# Patient Record
Sex: Male | Born: 1990 | Race: Black or African American | Hispanic: No | Marital: Married | Smoking: Current some day smoker
Health system: Southern US, Community
[De-identification: ages and names within clinical notes are randomized; demographics above are authoritative.]

## PROBLEM LIST (undated history)

## (undated) ENCOUNTER — Emergency Department (HOSPITAL_COMMUNITY): Admission: EM | Payer: Self-pay

## (undated) HISTORY — PX: APPENDECTOMY: SHX54

---
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
## 2010-10-31 NOTE — Discharge Summary (Signed)
   NAMECABELL, LAZENBY                        ACCOUNT NO.:  [id number]   MEDICAL RECORD NO.:  [id number]                   PATIENT TYPE:  INP   LOCATION:  A315                                 FACILITY:  [facility]   PHYSICIAN:  Dirk Dress. Katrinka Blazing, M.D.                DATE OF BIRTH:  03/10/1991   DATE OF ADMISSION:  09/03/2002  DATE OF DISCHARGE:  09/06/2002                                 DISCHARGE SUMMARY   DISCHARGE DIAGNOSIS:  Acute appendicitis.   SPECIAL PROCEDURE:  Laparoscopic appendectomy.   DISPOSITION:  The patient is discharged home in stable and satisfactory  condition.   DISCHARGE MEDICATIONS:  1. Tylenol with codeine 1 every four hours as needed for pain.  2. Keflex 500 mg four times a day for five days.   SUMMARY:  An 20 year old male child with a 36-hour history of abdominal pain  without nausea or vomiting.  The pain progressed throughout the day.  The  pain started initially in his upper abdomen and the right rib cage but later  it localized to the  right lower quadrant.  The patient was evaluated and  was noted to be afebrile though he had a white count of 15,000.  CT scan  showed an enlarged, inflamed appendix with an appendicolith and  periappendiceal inflammation.   PAST MEDICAL HISTORY:  Unremarkable.   PHYSICAL EXAMINATION:  ABDOMEN:  There is tenderness more prominent in the  lower abdomen.  He also had some suprapubic tenderness.   DISPOSITION:  After admission the patient was taken to the operating room  and explored.  He was found to have an acute appendicitis which was removed  laparoscopically without incident.  He had an uneventful postoperative  course.  He remained afebrile.  White count remained normal.  His diet was  advanced without difficulty and he was discharged home on March 24 in  satisfactory condition.      Dirk Dress. Katrinka Blazing, M.D.                      Dirk Dress. Katrinka Blazing, M.D.    LCS/MEDQ  D:  09/23/2002  T:  09/23/2002  Job:  [id number]

## 2010-10-31 NOTE — H&P (Signed)
Joseph Fields, Joseph Fields                        ACCOUNT NO.:  [id number]   MEDICAL RECORD NO.:  [id number]                   PATIENT TYPE:  INP   LOCATION:  A315                                 FACILITY:  [facility]   PHYSICIAN:  Dirk Dress. Katrinka Blazing, M.D.                DATE OF BIRTH:  1990/08/10   DATE OF ADMISSION:  09/03/2002  DATE OF DISCHARGE:                                HISTORY & PHYSICAL   HISTORY OF PRESENT ILLNESS:  [age]-year-old male child with a 36-hour  history of abdominal pain without nausea or vomiting.  The patient had onset  of pain about midday yesterday.  The pain progressed throughout the day.  He  initially complained of rib cage pain but later complained of pain in his  lower abdomen.  He had no nausea, vomiting and no diarrhea.  He was  evaluated in the emergency room and was noted to be afebrile, but he had a  white count of 15,900.  CT scan showed an enlarged, inflamed appendix with  an appendicolith and periappendiceal inflammation.   PAST HISTORY:  He has no major illness.  He does have ADHD and is on  maintenance Ritalin; mother does not know the dose.  He has not had prior hospitalizations.   FAMILY HISTORY:  There is a family history of sickle cell disease,  hypertension, diabetes mellitus and asthma.   IMMUNIZATIONS:  All of his immunizations are up to date.   PHYSICAL EXAMINATION:  GENERAL:  He is a pleasant, calm child in no acute  distress.  VITAL SIGNS:  Blood pressure 114/56, pulse 78, respirations 20, temperature  97.9.  HEENT:  No abnormality.  Teeth are in fair repair.  Oropharynx is  unremarkable.  NECK:  Neck supple.  No JVD, adenopathy, thyromegaly.  CHEST:  Chest clear to auscultation.  No rubs, rales, rhonchi or wheezes.  HEART:  Regular rate and rhythm without murmur, gallop or rub.  There is no  tachycardia.  There is a very soft systolic murmur.  ABDOMEN:  Mildly distended.  Active bowel sounds.  Diffuse tenderness, more  prominent  tenderness in lower abdomen, difficult to determine whether or not  it is more prominent on the left or the right.  He does have significant  suprapubic tenderness.  He has guarding.  There are no masses.  EXTREMITIES:  He has normal soft tissue and joint exam.  He has full pulses,  no cyanosis or clubbing.  NEUROLOGIC:  No motor, sensory or cerebellar deficit.   IMPRESSION:  Acute appendicitis.   PLAN:  The patient will be admitted, treated with IV Ancef and given IV  analgesics.  He will receive antipyretics if he should develop an elevated  temperature.  His appendectomy is scheduled for the early morning.  Mother  has been counseled for the procedure.  Dirk Dress. Katrinka Blazing, M.D.     LCS/MEDQ  D:  09/04/2002  T:  09/04/2002  Job:  [id number]

## 2010-10-31 NOTE — Op Note (Signed)
   Joseph Fields, Joseph Fields                        ACCOUNT NO.:  [id number]   MEDICAL RECORD NO.:  [id number]                   PATIENT TYPE:  INP   LOCATION:  A315                                 FACILITY:  [facility]   PHYSICIAN:  Dirk Dress. Katrinka Blazing, M.D.                DATE OF BIRTH:  1990/09/06   DATE OF PROCEDURE:  09/04/2002  DATE OF DISCHARGE:                                 OPERATIVE REPORT   PREOPERATIVE DIAGNOSES:  Acute appendicitis.   POSTOPERATIVE DIAGNOSES:  Acute appendicitis.   PROCEDURE:  Laparoscopic appendectomy.   SURGEON:  Elpidio Anis, M.D.   DESCRIPTION OF PROCEDURE:  Under general anesthesia, the patient's abdomen  was prepped and draped in a sterile field. A supraumbilical incision was  made. A Veress needle was inserted. The abdomen was insufflated with 2  liters of CO2. Using a Visiport guide, a 10 mm port was placed uneventfully.  A laparoscope was placed. The inflamed appendix was identified. Under  videoscopic guidance, a suprapubic 5 mm port and a left lower quadrant 12 mm  port were placed under videoscopic guidance. The appendix was grasped with a  grasper. The mesoappendix was dissected, clips with multiple clips and  divided down through the base of the appendix. The base of the appendix was  transected using an EndoGIA stapler with 2.5 mm staples. The staple line was  inspected. The appendix was retrieved. Copious irrigation was carried out.  There was no bleeding from the mesoappendix or the stump. There was a small  amount of fluid in the pelvis which was suctioned and irrigated. The patient  tolerated the procedure well. CO2 was allowed to escape from the abdomen and  the ports were removed. The incisions were closed using #0 Dexon on the  fascia of the two larger incisions and staples on the skin. Dressings were  placed. The patient was awakened from anesthesia uneventfully, transferred  to a bed and taken to the post anesthesia care unit.                                        Dirk Dress. Katrinka Blazing, M.D.    LCS/MEDQ  D:  09/04/2002  T:  09/04/2002  Job:  [id number]

## 2013-12-08 ENCOUNTER — Encounter (HOSPITAL_COMMUNITY): Payer: Self-pay | Admitting: Emergency Medicine

## 2013-12-08 ENCOUNTER — Emergency Department (HOSPITAL_COMMUNITY)
Admission: EM | Admit: 2013-12-08 | Discharge: 2013-12-08 | Disposition: A | Discharge status: Home / Self Care | Payer: Self-pay | Attending: Emergency Medicine | Admitting: Emergency Medicine

## 2013-12-08 DIAGNOSIS — Z792 Long term (current) use of antibiotics: Secondary | ICD-10-CM | POA: Insufficient documentation

## 2013-12-08 DIAGNOSIS — K029 Dental caries, unspecified: Secondary | ICD-10-CM | POA: Insufficient documentation

## 2013-12-08 DIAGNOSIS — Z87828 Personal history of other (healed) physical injury and trauma: Secondary | ICD-10-CM | POA: Insufficient documentation

## 2013-12-08 DIAGNOSIS — K047 Periapical abscess without sinus: Secondary | ICD-10-CM | POA: Insufficient documentation

## 2013-12-08 DIAGNOSIS — F17200 Nicotine dependence, unspecified, uncomplicated: Secondary | ICD-10-CM | POA: Insufficient documentation

## 2013-12-08 MED ORDER — HYDROCODONE-ACETAMINOPHEN 5-325 MG PO TABS
1.0000 | ORAL_TABLET | Freq: Once | ORAL | Status: AC
  Administered 2013-12-08: 1 via ORAL
  Filled 2013-12-08: qty 1

## 2013-12-08 MED ORDER — IBUPROFEN 600 MG PO TABS: 600.0000 mg | ORAL_TABLET | Freq: Four times a day (QID) | ORAL | Status: DC | PRN

## 2013-12-08 MED ORDER — AMOXICILLIN 500 MG PO CAPS: 500.0000 mg | ORAL_CAPSULE | Freq: Three times a day (TID) | ORAL | Status: DC

## 2013-12-08 MED ORDER — AMOXICILLIN 250 MG PO CAPS
500.0000 mg | ORAL_CAPSULE | Freq: Once | ORAL | Status: AC
  Administered 2013-12-08: 500 mg via ORAL
  Filled 2013-12-08: qty 2

## 2013-12-08 MED ORDER — HYDROMORPHONE HCL PF 1 MG/ML IJ SOLN: 1.0000 mg | Freq: Once | INTRAMUSCULAR | Status: DC

## 2013-12-08 MED ORDER — HYDROCODONE-ACETAMINOPHEN 5-325 MG PO TABS: 1.0000 | ORAL_TABLET | ORAL | Status: DC | PRN

## 2013-12-08 NOTE — Discharge Instructions (Signed)
Abscessed Tooth °An abscessed tooth is an infection around your tooth. It may be caused by holes or damage to the tooth (cavity) or a dental disease. An abscessed tooth causes mild to very bad pain in and around the tooth. See your dentist right away if you have tooth or gum pain. °HOME CARE °· Take your medicine as told. Finish it even if you start to feel better. °· Do not drive after taking pain medicine. °· Rinse your mouth (gargle) often with salt water (¼ teaspoon salt in 8 ounces of warm water). °· Do not apply heat to the outside of your face. °GET HELP RIGHT AWAY IF:  °· You have a temperature by mouth above 102° F (38.9° C), not controlled by medicine. °· You have chills and a very bad headache. °· You have problems breathing or swallowing. °· Your mouth will not open. °· You develop puffiness (swelling) on the neck or around the eye. °· Your pain is not helped by medicine. °· Your pain is getting worse instead of better. °MAKE SURE YOU:  °· Understand these instructions. °· Will watch your condition. °· Will get help right away if you are not doing well or get worse. °Document Released: 11/18/2007 Document Revised: 08/24/2011 Document Reviewed: 09/09/2010 °[employer]® Patient Information ©2015 [employer]. This information is not intended to replace advice given to you by your health care provider. Make sure you discuss any questions you have with your health care provider. ° ° ° °Complete your entire course of antibiotics as prescribed.  You  may use the hydrocodone for pain relief but do not drive within 4 hours of taking as this will make you drowsy.  Avoid applying heat or ice to this abscess area which can worsen your symptoms.  You may use warm salt water swish and spit treatment or half peroxide and water swish and spit after meals to keep this area clean as discussed.  Call the dentist listed above for further management of your symptoms. ° ° °

## 2013-12-08 NOTE — ED Notes (Signed)
Reviewed local dental resources w/patient.

## 2013-12-08 NOTE — ED Provider Notes (Signed)
CSN: [id number]     Arrival date & time 12/08/13  1245 History   First MD Initiated Contact with Patient 12/08/13 1328     Chief Complaint  Patient presents with  . Dental Pain     (Consider location/radiation/quality/duration/timing/severity/associated sxs/prior Treatment) HPI Comments: Joseph Fields is a 23 y.o. Male presenting with 2 week history of dental pain and now has developed gingival swelling over the past 2 days.   The patient has a history of injury and/or decay in the tooth involved which has recently started to cause increased  pain.  There has been no fevers, chills, nausea or vomiting, also no complaint of difficulty swallowing, although chewing makes pain worse.  The patient has tried no medicines without relief of symptoms.         The history is provided by the patient.    History reviewed. No pertinent past medical history. Past Surgical History  Procedure Laterality Date  . Appendectomy     History reviewed. No pertinent family history. History  Substance Use Topics  . Smoking status: Current Some Day Smoker  . Smokeless tobacco: Not on file  . Alcohol Use: Yes    Review of Systems  Constitutional: Negative for fever.  HENT: Positive for dental problem. Negative for facial swelling and sore throat.   Respiratory: Negative for shortness of breath.   Musculoskeletal: Negative for neck pain and neck stiffness.      Allergies  Review of patient's allergies indicates no known allergies.  Home Medications   Prior to Admission medications   Medication Sig Start Date End Date Taking? Authorizing Provider  amoxicillin (AMOXIL) 500 MG capsule Take 1 capsule (500 mg total) by mouth 3 (three) times daily. 12/08/13   Burgess AmorJulie Idol, PA-C  HYDROcodone-acetaminophen (NORCO/VICODIN) 5-325 MG per tablet Take 1 tablet by mouth every 4 (four) hours as needed for severe pain. 12/08/13   Burgess AmorJulie Idol, PA-C  ibuprofen (ADVIL,MOTRIN) 600 MG tablet Take 1 tablet (600 mg  total) by mouth every 6 (six) hours as needed for mild pain. 12/08/13   Burgess AmorJulie Idol, PA-C   BP 116/65  Pulse 74  Temp(Src) 97.9 F (36.6 C) (Oral)  Resp 18  Ht 5\' 8"  (1.727 m)  Wt 146 lb (66.225 kg)  BMI 22.20 kg/m2  SpO2 98% Physical Exam  Constitutional: He is oriented to person, place, and time. He appears well-developed and well-nourished. No distress.  HENT:  Head: Normocephalic and atraumatic.  Right Ear: Tympanic membrane and external ear normal.  Left Ear: Tympanic membrane and external ear normal.  Mouth/Throat: Oropharynx is clear and moist and mucous membranes are normal. No oral lesions. No trismus in the jaw. Dental abscesses present.    Induration along left jaw line.  There is no facial erythema.  There is no fluctuant mass or evidence of a drainable abscess collection.  Deep cavities in his left lower molar teeth.  Generalized dental decay.  Eyes: Conjunctivae are normal.  Neck: Normal range of motion. Neck supple.  Cardiovascular: Normal rate and normal heart sounds.   Pulmonary/Chest: Effort normal.  Abdominal: He exhibits no distension.  Musculoskeletal: Normal range of motion.  Lymphadenopathy:    He has no cervical adenopathy.  Neurological: He is alert and oriented to person, place, and time.  Skin: Skin is warm and dry. No erythema.  Psychiatric: He has a normal mood and affect.    ED Course  Procedures (including critical care time) Labs Review Labs Reviewed - No data to display  Imaging Review No results found.   EKG Interpretation None      MDM   Final diagnoses:  Dental abscess    Amoxil, hydrocodone and ibuprofen.  Dental referrals given.  No evidence of drainable dental abscess.  There is no facial infection at this time, no trismus.    Burgess AmorJulie Idol, PA-C 12/08/13 1407

## 2013-12-08 NOTE — ED Provider Notes (Signed)
Medical screening examination/treatment/procedure(s) were performed by non-physician practitioner and as supervising physician I was immediately available for consultation/collaboration.   EKG Interpretation None        Joseph L Zammit, MD 12/08/13 1529 

## 2013-12-08 NOTE — ED Notes (Signed)
Dental pain lt mandibular molar. For 2 weeks

## 2013-12-08 NOTE — ED Notes (Signed)
Patient with no complaints at this time. Respirations even and unlabored. Skin warm/dry. Discharge instructions reviewed with patient at this time. Patient given opportunity to voice concerns/ask questions. Patient discharged at this time and left Emergency Department with steady gait.   

## 2014-02-27 ENCOUNTER — Emergency Department (INDEPENDENT_AMBULATORY_CARE_PROVIDER_SITE_OTHER)
Admission: EM | Admit: 2014-02-27 | Discharge: 2014-02-27 | Disposition: A | Discharge status: Home / Self Care | Payer: Self-pay | Source: Home / Self Care | Attending: Family Medicine | Admitting: Family Medicine

## 2014-02-27 ENCOUNTER — Encounter (HOSPITAL_COMMUNITY): Payer: Self-pay | Admitting: Emergency Medicine

## 2014-02-27 DIAGNOSIS — G8929 Other chronic pain: Secondary | ICD-10-CM

## 2014-02-27 DIAGNOSIS — K089 Disorder of teeth and supporting structures, unspecified: Secondary | ICD-10-CM

## 2014-02-27 MED ORDER — DICLOFENAC POTASSIUM 50 MG PO TABS: 50.0000 mg | ORAL_TABLET | Freq: Three times a day (TID) | ORAL | Status: DC

## 2014-02-27 MED ORDER — CLINDAMYCIN HCL 300 MG PO CAPS: 300.0000 mg | ORAL_CAPSULE | Freq: Three times a day (TID) | ORAL | Status: DC

## 2014-02-27 NOTE — ED Notes (Signed)
Left bottom tooth pain, onset 3 days ago.

## 2014-02-27 NOTE — ED Provider Notes (Signed)
CSN: [id number]     Arrival date & time 02/27/14  1038 History   First MD Initiated Contact with Patient 02/27/14 1104     Chief Complaint  Patient presents with  . Dental Pain   (Consider location/radiation/quality/duration/timing/severity/associated sxs/prior Treatment) Patient is a 23 y.o. male presenting with tooth pain. The history is provided by the patient.  Dental Pain Location:  Lower Lower teeth location:  17/LL 3rd molar, 18/LL 2nd molar, 19/LL 1st molar, 30/RL 1st molar, 31/RL 2nd molar and 32/RL 3rd molar Quality:  Throbbing Severity:  Moderate Onset quality:  Gradual Duration:  4 days Progression:  Waxing and waning Chronicity:  Recurrent Context: abscess, dental caries, dental fracture and poor dentition   Relieved by:  None tried Associated symptoms: facial pain and facial swelling   Associated symptoms: no fever   Risk factors: lack of dental care, periodontal disease and smoking     History reviewed. No pertinent past medical history. Past Surgical History  Procedure Laterality Date  . Appendectomy     No family history on file. History  Substance Use Topics  . Smoking status: Current Some Day Smoker  . Smokeless tobacco: Not on file  . Alcohol Use: Yes    Review of Systems  Constitutional: Negative.  Negative for fever.  HENT: Positive for dental problem and facial swelling.     Allergies  Review of patient's allergies indicates no known allergies.  Home Medications   Prior to Admission medications   Medication Sig Start Date End Date Taking? Authorizing Provider  amoxicillin (AMOXIL) 500 MG capsule Take 1 capsule (500 mg total) by mouth 3 (three) times daily. 12/08/13   Burgess Amor, PA-C  clindamycin (CLEOCIN) 300 MG capsule Take 1 capsule (300 mg total) by mouth 3 (three) times daily. 02/27/14   Linna Hoff, MD  diclofenac (CATAFLAM) 50 MG tablet Take 1 tablet (50 mg total) by mouth 3 (three) times daily. 02/27/14   Linna Hoff, MD   HYDROcodone-acetaminophen (NORCO/VICODIN) 5-325 MG per tablet Take 1 tablet by mouth every 4 (four) hours as needed for severe pain. 12/08/13   Burgess Amor, PA-C  ibuprofen (ADVIL,MOTRIN) 600 MG tablet Take 1 tablet (600 mg total) by mouth every 6 (six) hours as needed for mild pain. 12/08/13   Burgess Amor, PA-C   BP 110/73  Pulse 56  Temp(Src) 99 F (37.2 C) (Oral)  Resp 16  SpO2 99% Physical Exam  Nursing note and vitals reviewed. Constitutional: He is oriented to person, place, and time. He appears well-developed and well-nourished.  HENT:  Right Ear: External ear normal.  Left Ear: External ear normal.  Mouth/Throat: Uvula is midline, oropharynx is clear and moist and mucous membranes are normal. Abnormal dentition. Dental abscesses and dental caries present. No posterior oropharyngeal edema or posterior oropharyngeal erythema.  Neck: Normal range of motion. Neck supple.  Lymphadenopathy:    He has cervical adenopathy.  Neurological: He is alert and oriented to person, place, and time.  Skin: Skin is warm and dry.    ED Course  Procedures (including critical care time) Labs Review Labs Reviewed - No data to display  Imaging Review No results found.   MDM   1. Chronic dental pain        Linna Hoff, MD 02/27/14 [phone]

## 2014-02-27 NOTE — Discharge Instructions (Signed)
Take medicine as prescribed, see your dentist as soon as possible °

## 2016-07-11 ENCOUNTER — Encounter (HOSPITAL_COMMUNITY): Payer: Self-pay

## 2016-07-11 ENCOUNTER — Emergency Department (HOSPITAL_COMMUNITY)
Admission: EM | Admit: 2016-07-11 | Discharge: 2016-07-11 | Disposition: A | Discharge status: Home / Self Care | Payer: Self-pay | Attending: Emergency Medicine | Admitting: Emergency Medicine

## 2016-07-11 DIAGNOSIS — H1032 Unspecified acute conjunctivitis, left eye: Secondary | ICD-10-CM

## 2016-07-11 DIAGNOSIS — F17210 Nicotine dependence, cigarettes, uncomplicated: Secondary | ICD-10-CM | POA: Insufficient documentation

## 2016-07-11 DIAGNOSIS — Z79899 Other long term (current) drug therapy: Secondary | ICD-10-CM | POA: Insufficient documentation

## 2016-07-11 MED ORDER — FLUORESCEIN SODIUM 0.6 MG OP STRP
1.0000 | ORAL_STRIP | Freq: Once | OPHTHALMIC | Status: AC
  Administered 2016-07-11: 1 via OPHTHALMIC

## 2016-07-11 MED ORDER — TETRACAINE HCL 0.5 % OP SOLN
2.0000 [drp] | Freq: Once | OPHTHALMIC | Status: AC
  Administered 2016-07-11: 2 [drp] via OPHTHALMIC
  Filled 2016-07-11: qty 4

## 2016-07-11 MED ORDER — ERYTHROMYCIN 5 MG/GM OP OINT: TOPICAL_OINTMENT | OPHTHALMIC | 0 refills | Status: DC

## 2016-07-11 NOTE — ED Triage Notes (Addendum)
Pt c/o L eye irritation and redness x 2 days.  Pain score 3/10.  Pinkness noted to sclera.  Denies drainage.

## 2016-07-11 NOTE — Discharge Instructions (Signed)
1. Medications: erythromycin, usual home medications 2. Treatment: rest, drink plenty of fluids,  3. Follow Up: Please followup with ophthalmology in 2 days for discussion of your diagnoses and further evaluation after today's visit; if you do not have a primary care doctor use the resource guide provided to find one; Please return to the ER for worsening symptoms, changes in vision or other concerns.

## 2016-07-11 NOTE — ED Provider Notes (Signed)
[facility]EMERGENCY DEPT Provider Note   CSN: [id number] Arrival date & time: 07/11/16  1837  By signing my name below, I, Javier Docker, attest that this documentation has been prepared under the direction and in the presence of [employer], PA-C. Electronically Signed: Javier Docker, ER Scribe. 01/25/2016. 9:39 PM.  History   Chief Complaint Chief Complaint  Patient presents with  . Eye Problem   The history is provided by the patient. No language interpreter was used.   HPI Comments: Joseph Fields is a 26 y.o. male who presents to the Emergency Department complaining of two days of worsening left eye pain with associated visual disturbance and eye watering. He states he feels like there is a foreign body in his eye, but does not recall anything falling into his eye. He denies fever, chills, thick discharge from eye, nasal congestion or cough. He has used clear eyes and water without relief. No aggravating symptoms.     History reviewed. No pertinent past medical history.  There are no active problems to display for this patient.   Past Surgical History:  Procedure Laterality Date  . APPENDECTOMY         Home Medications    Prior to Admission medications   Medication Sig Start Date End Date Taking? Authorizing Provider  amoxicillin (AMOXIL) 500 MG capsule Take 1 capsule (500 mg total) by mouth 3 (three) times daily. 12/08/13   Burgess Amor, PA-C  clindamycin (CLEOCIN) 300 MG capsule Take 1 capsule (300 mg total) by mouth 3 (three) times daily. 02/27/14   Linna Hoff, MD  diclofenac (CATAFLAM) 50 MG tablet Take 1 tablet (50 mg total) by mouth 3 (three) times daily. 02/27/14   Linna Hoff, MD  erythromycin ophthalmic ointment Place a 1/2 inch ribbon of ointment into the lower eyelid every 4 hours for 5 days 07/11/16   Dahlia Client Zacary Bauer, PA-C  HYDROcodone-acetaminophen (NORCO/VICODIN) 5-325 MG per tablet Take 1 tablet by mouth every 4 (four) hours as needed for  severe pain. 12/08/13   Burgess Amor, PA-C  ibuprofen (ADVIL,MOTRIN) 600 MG tablet Take 1 tablet (600 mg total) by mouth every 6 (six) hours as needed for mild pain. 12/08/13   Burgess Amor, PA-C    Family History History reviewed. No pertinent family history.  Social History Social History  Substance Use Topics  . Smoking status: Current Some Day Smoker    Packs/day: 0.50    Types: Cigarettes  . Smokeless tobacco: Never Used  . Alcohol use Yes     Allergies   Patient has no known allergies.   Review of Systems Review of Systems  Constitutional: Negative for chills and fever.  HENT: Negative for congestion and rhinorrhea.   Eyes: Positive for pain and visual disturbance.  Respiratory: Negative for cough.   All other systems reviewed and are negative.   Physical Exam Updated Vital Signs BP 125/77 (BP Location: Right Arm)   Pulse 68   Temp 98.5 F (36.9 C) (Oral)   Resp 16   Ht 5\' 9"  (1.753 m)   Wt 135 lb (61.2 kg)   SpO2 97%   BMI 19.94 kg/m   Physical Exam  Constitutional: He is oriented to person, place, and time. He appears well-developed and well-nourished. No distress.  HENT:  Head: Normocephalic and atraumatic.  Nose: Nose normal. No mucosal edema or rhinorrhea.  Mouth/Throat: Uvula is midline, oropharynx is clear and moist and mucous membranes are normal. No uvula swelling. No oropharyngeal exudate, posterior  oropharyngeal edema, posterior oropharyngeal erythema or tonsillar abscesses.  Eyes: Conjunctivae, EOM and lids are normal. Pupils are equal, round, and reactive to light. Lids are everted and swept, no foreign bodies found. Right eye exhibits no chemosis, no discharge and no exudate. No foreign body present in the right eye. Left eye exhibits no chemosis, no discharge and no exudate. No foreign body present in the left eye. Right conjunctiva is not injected. Right conjunctiva has no hemorrhage. Left conjunctiva is not injected. Left conjunctiva has no  hemorrhage.  Slit lamp exam:      The left eye shows no corneal abrasion, no corneal flare, no corneal ulcer, no foreign body, no fluorescein uptake and no anterior chamber bulge.  Pupils equal round and reactive to light No vertical, horizontal or rotational nystagmus No Corneal abrasion noted to the left eye and no fluorescein uptake No visible foreign body No corneal flare, ulcer or dendritic staining  No herpetic lesions to the face or around the eye  Visual Acuity:  Bilateral Distance: 20/20  R Distance: 20/20 L Distance: 20/20   Neck: Normal range of motion.  Full range of motion without pain No midline or paraspinal tenderness No nuchal rigidity; no meningeal signs  Cardiovascular: Normal rate, regular rhythm and intact distal pulses.   Pulmonary/Chest: Effort normal. No respiratory distress.  Musculoskeletal: Normal range of motion.  Neurological: He is alert and oriented to person, place, and time.  Mental Status:  Alert, oriented, thought content appropriate. Speech fluent without evidence of aphasia. Able to follow 2 step commands without difficulty.   Cranial Nerves:  II:  Peripheral visual fields grossly normal, pupils equal, round, reactive to light III,IV, VI: ptosis not present, extra-ocular motions intact bilaterally  V,VII: smile symmetric, VIII: hearing grossly normal bilaterally  IX,X: midline uvula rise XII: midline tongue extension   Skin: Skin is warm and dry. He is not diaphoretic. No erythema.  Psychiatric: He has a normal mood and affect.  Nursing note and vitals reviewed.   ED Treatments / Results  DIAGNOSTIC STUDIES: Oxygen Saturation is 97% on RA, normal by my interpretation.    COORDINATION OF CARE: 9:37 PM Discussed treatment plan with pt at bedside and pt agreed to plan.   Procedures Procedures (including critical care time)  Medications Ordered in ED Medications  fluorescein ophthalmic strip 1 strip (1 strip Left Eye Given 07/11/16  2135)  tetracaine (PONTOCAINE) 0.5 % ophthalmic solution 2 drop (2 drops Left Eye Given 07/11/16 2135)     Initial Impression / Assessment and Plan / ED Course  I have reviewed the triage vital signs and the nursing notes.  Pertinent labs & imaging results that were available during my care of the patient were reviewed by me and considered in my medical decision making (see chart for details).     Corderro R Christell ConstantMoore presents with symptoms more consistent with bacterial conjunctivitis he has no viral symptoms.  Small amount of Purulent discharge exam.  No corneal abrasions, entrapment, consensual photophobia, or dendritic staining with fluorescein study.  Presentation non-concerning for iritis, corneal abrasions, or HSV.  No evidence of preseptal or orbital cellulitis.  Pt is not a contact lens wearer.  Patient will be given erythromycin ophthalmic.  Personal hygiene and frequent handwashing discussed.  Patient advised to followup with ophthalmologist for reevaluation in several days..  Patient verbalizes understanding and is agreeable with discharge.     Final Clinical Impressions(s) / ED Diagnoses   Final diagnoses:  Acute conjunctivitis of left  eye, unspecified acute conjunctivitis type    New Prescriptions New Prescriptions   ERYTHROMYCIN OPHTHALMIC OINTMENT    Place a 1/2 inch ribbon of ointment into the lower eyelid every 4 hours for 5 days   I personally performed the services described in this documentation, which was scribed in my presence. The recorded information has been reviewed and is accurate.       Dahlia Client Kaitlyne Friedhoff, PA-C 07/11/16 2150    Loren Racer, MD 07/12/16 [phone]

## 2017-03-19 ENCOUNTER — Encounter (HOSPITAL_COMMUNITY): Payer: Self-pay | Admitting: Emergency Medicine

## 2017-03-19 ENCOUNTER — Ambulatory Visit (HOSPITAL_COMMUNITY)
Admission: EM | Admit: 2017-03-19 | Discharge: 2017-03-19 | Disposition: A | Discharge status: Home / Self Care | Payer: Self-pay | Attending: Physician Assistant | Admitting: Physician Assistant

## 2017-03-19 DIAGNOSIS — B349 Viral infection, unspecified: Secondary | ICD-10-CM

## 2017-03-19 MED ORDER — IBUPROFEN 600 MG PO TABS: 600.0000 mg | ORAL_TABLET | Freq: Four times a day (QID) | ORAL | 0 refills | Status: AC | PRN

## 2017-03-19 MED ORDER — FLUTICASONE PROPIONATE 50 MCG/ACT NA SUSP: 2.0000 | Freq: Every day | NASAL | 0 refills | Status: AC

## 2017-03-19 MED ORDER — CETIRIZINE-PSEUDOEPHEDRINE ER 5-120 MG PO TB12: 1.0000 | ORAL_TABLET | Freq: Every day | ORAL | 0 refills | Status: AC

## 2017-03-19 NOTE — Discharge Instructions (Signed)
Start flonase, zyrtec-D for nasal congestion. You can use over the counter nasal saline rinse such as neti pot for nasal congestion. Keep hydrated, your urine should be clear to pale yellow in color. Tylenol/motrin for fever and pain.  Zofran for nausea and vomiting as needed. Bland diet as attached, advance as tolerated. Probiotics after diarrhea resolves. Monitor for any worsening of symptoms, chest pain, shortness of breath, wheezing, swelling of the throat, abdominal pain, nausea/vomiting, fainting, follow up for reevaluation.

## 2017-03-19 NOTE — ED Provider Notes (Signed)
[facility]    CSN: [id number] Arrival date & time: 03/19/17  1041     History   Chief Complaint Chief Complaint  Patient presents with  . Generalized Body Aches    HPI Joseph Fields is a 26 y.o. male.   26 year old male coming in for 1 day history of body ache, headache, diarrhea. Denies fever, chills, night sweats. Denies cough, congestion, sore throat. Denies abdominal pain, nausea, vomiting. Patient states he just notices more noises coming from the abdomen. Has had about 2 episodes of diarrhea, without blood. Throbbing headache with photophobia that is frontal in nature. Denies weakness, dizziness. Took some muscle relaxant with some relief. Otherwise has not taken anything else. No sick contact.       History reviewed. No pertinent past medical history.  There are no active problems to display for this patient.   Past Surgical History:  Procedure Laterality Date  . APPENDECTOMY         Home Medications    Prior to Admission medications   Medication Sig Start Date End Date Taking? Authorizing Provider  cetirizine-pseudoephedrine (ZYRTEC-D) 5-120 MG tablet Take 1 tablet by mouth daily. 03/19/17   Cathie Hoops, Amy V, PA-C  fluticasone (FLONASE) 50 MCG/ACT nasal spray Place 2 sprays into both nostrils daily. 03/19/17   Cathie Hoops, Amy V, PA-C  ibuprofen (ADVIL,MOTRIN) 600 MG tablet Take 1 tablet (600 mg total) by mouth every 6 (six) hours as needed. 03/19/17   Belinda Fisher, PA-C    Family History History reviewed. No pertinent family history.  Social History Social History  Substance Use Topics  . Smoking status: Current Some Day Smoker    Packs/day: 0.50    Types: Cigarettes  . Smokeless tobacco: Never Used  . Alcohol use Yes     Allergies   Patient has no known allergies.   Review of Systems Review of Systems  Reason unable to perform ROS: See HPI as above.     Physical Exam Triage Vital Signs ED Triage Vitals [03/19/17 1109]  Enc Vitals Group    BP (!) 114/57     Pulse Rate 60     Resp 20     Temp 98 F (36.7 C)     Temp Source Oral     SpO2 100 %     Weight      Height      Head Circumference      Peak Flow      Pain Score 7     Pain Loc      Pain Edu?      Excl. in GC?    No data found.   Updated Vital Signs BP (!) 114/57 (BP Location: Left Arm)   Pulse 60   Temp 98 F (36.7 C) (Oral)   Resp 20   SpO2 100%   Physical Exam  Constitutional: He is oriented to person, place, and time. He appears well-developed and well-nourished. No distress.  HENT:  Head: Normocephalic and atraumatic.  Right Ear: Tympanic membrane, external ear and ear canal normal. Tympanic membrane is not erythematous and not bulging.  Left Ear: Tympanic membrane, external ear and ear canal normal. Tympanic membrane is not erythematous and not bulging.  Nose: Mucosal edema and rhinorrhea present. Right sinus exhibits frontal sinus tenderness. Right sinus exhibits no maxillary sinus tenderness. Left sinus exhibits frontal sinus tenderness. Left sinus exhibits no maxillary sinus tenderness.  Mouth/Throat: Uvula is midline, oropharynx is clear and moist and mucous  membranes are normal.  Eyes: Pupils are equal, round, and reactive to light. Conjunctivae and EOM are normal.  Neck: Normal range of motion. Neck supple.  Cardiovascular: Normal rate, regular rhythm and normal heart sounds.  Exam reveals no gallop and no friction rub.   No murmur heard. Pulmonary/Chest: Effort normal and breath sounds normal. He has no decreased breath sounds. He has no wheezes. He has no rhonchi. He has no rales.  Abdominal: Soft. Bowel sounds are normal. He exhibits no mass. There is no tenderness. There is no rebound and no guarding.  Lymphadenopathy:    He has no cervical adenopathy.  Neurological: He is alert and oriented to person, place, and time. He is not disoriented.  Normal gait.  Skin: Skin is warm and dry.  Psychiatric: He has a normal mood and affect. His  behavior is normal. Judgment normal.     UC Treatments / Results  Labs (all labs ordered are listed, but only abnormal results are displayed) Labs Reviewed - No data to display  EKG  EKG Interpretation None       Radiology No results found.  Procedures Procedures (including critical care time)  Medications Ordered in UC Medications - No data to display   Initial Impression / Assessment and Plan / UC Course  I have reviewed the triage vital signs and the nursing notes.  Pertinent labs & imaging results that were available during my care of the patient were reviewed by me and considered in my medical decision making (see chart for details).    No alarming signs on exam. Discussed with patient this is most likely viral in nature. Symptomatic treatment provided. Bland diet, advance as tolerated. Return precautions given.   Final Clinical Impressions(s) / UC Diagnoses   Final diagnoses:  Viral illness    New Prescriptions Discharge Medication List as of 03/19/2017 12:03 PM    START taking these medications   Details  cetirizine-pseudoephedrine (ZYRTEC-D) 5-120 MG tablet Take 1 tablet by mouth daily., Starting Fri 03/19/2017, Normal    fluticasone (FLONASE) 50 MCG/ACT nasal spray Place 2 sprays into both nostrils daily., Starting Fri 03/19/2017, Normal          Cathie Hoops, Amy V, PA-C 03/19/17 1207

## 2017-03-19 NOTE — ED Triage Notes (Signed)
Pt here for generalized BAs since this am associated w/HA, diarrhea  Denies fevers, chills  Needing note for work  A&O x4... NAD... Ambulatory

## 2017-09-09 ENCOUNTER — Emergency Department (HOSPITAL_COMMUNITY)
Admission: EM | Admit: 2017-09-09 | Discharge: 2017-09-10 | Disposition: A | Discharge status: Home / Self Care | Payer: Self-pay | Attending: Emergency Medicine | Admitting: Emergency Medicine

## 2017-09-09 DIAGNOSIS — M25511 Pain in right shoulder: Secondary | ICD-10-CM | POA: Insufficient documentation

## 2017-09-09 DIAGNOSIS — Z5321 Procedure and treatment not carried out due to patient leaving prior to being seen by health care provider: Secondary | ICD-10-CM | POA: Insufficient documentation

## 2017-09-10 ENCOUNTER — Emergency Department (HOSPITAL_COMMUNITY): Payer: Self-pay

## 2017-09-10 ENCOUNTER — Encounter (HOSPITAL_COMMUNITY): Payer: Self-pay | Admitting: Emergency Medicine

## 2017-09-10 NOTE — ED Triage Notes (Signed)
Patient fell on his right side while skating 2 days ago reports right shoulder joint pain/mild swelling worse with movement .

## 2017-09-10 NOTE — ED Notes (Signed)
N/A x3 for being roomed

## 2019-05-05 ENCOUNTER — Other Ambulatory Visit: Payer: Self-pay

## 2019-05-05 DIAGNOSIS — Z20822 Contact with and (suspected) exposure to covid-19: Secondary | ICD-10-CM

## 2019-05-07 LAB — NOVEL CORONAVIRUS, NAA: SARS-CoV-2, NAA: NOT DETECTED

## 2019-05-08 ENCOUNTER — Telehealth: Payer: Self-pay | Admitting: General Practice

## 2019-05-08 NOTE — Telephone Encounter (Signed)
Negative COVID results given. Patient results "NOT Detected." Caller expressed understanding. ° °

## 2019-06-02 ENCOUNTER — Other Ambulatory Visit: Payer: Self-pay

## 2019-06-02 ENCOUNTER — Encounter (HOSPITAL_COMMUNITY): Payer: Self-pay

## 2019-06-02 DIAGNOSIS — F17210 Nicotine dependence, cigarettes, uncomplicated: Secondary | ICD-10-CM | POA: Insufficient documentation

## 2019-06-02 DIAGNOSIS — T7840XA Allergy, unspecified, initial encounter: Secondary | ICD-10-CM | POA: Insufficient documentation

## 2019-06-02 DIAGNOSIS — F1210 Cannabis abuse, uncomplicated: Secondary | ICD-10-CM | POA: Insufficient documentation

## 2019-06-02 DIAGNOSIS — R21 Rash and other nonspecific skin eruption: Secondary | ICD-10-CM | POA: Insufficient documentation

## 2019-06-02 NOTE — ED Triage Notes (Signed)
Pt presents to ED with complaints of itchy rash noted to abdomen, back, bilateral arms. Pt states rash started 2 days ago. Pt denies changing in detergents. Pt denies SOB, or difficulty swallowing.

## 2019-06-03 ENCOUNTER — Emergency Department (HOSPITAL_COMMUNITY)
Admission: EM | Admit: 2019-06-03 | Discharge: 2019-06-03 | Disposition: A | Discharge status: Home / Self Care | Payer: Self-pay | Attending: Emergency Medicine | Admitting: Emergency Medicine

## 2019-06-03 DIAGNOSIS — R21 Rash and other nonspecific skin eruption: Secondary | ICD-10-CM

## 2019-06-03 DIAGNOSIS — T7840XA Allergy, unspecified, initial encounter: Secondary | ICD-10-CM

## 2019-06-03 MED ORDER — HYDROXYZINE HCL 25 MG PO TABS: 25.0000 mg | ORAL_TABLET | Freq: Four times a day (QID) | ORAL | 0 refills | Status: AC | PRN

## 2019-06-03 MED ORDER — PERMETHRIN 5 % EX CREA: TOPICAL_CREAM | CUTANEOUS | 0 refills | Status: AC

## 2019-06-03 MED ORDER — DEXAMETHASONE SODIUM PHOSPHATE 10 MG/ML IJ SOLN
10.0000 mg | Freq: Once | INTRAMUSCULAR | Status: AC
  Administered 2019-06-03: 10 mg via INTRAMUSCULAR
  Filled 2019-06-03: qty 1

## 2019-06-03 MED ORDER — METHYLPREDNISOLONE 4 MG PO TBPK: ORAL_TABLET | ORAL | 0 refills | Status: AC

## 2019-06-03 NOTE — ED Provider Notes (Signed)
[facility] EMERGENCY DEPARTMENT Provider Note   CSN: [id number] Arrival date & time: 06/02/19  2221     History Chief Complaint  Patient presents with  . Rash    Joseph Fields is a 28 y.o. male.  Patient presents to the emergency department for evaluation of itchy rash.  Patient reports that it started on his arms but now is everywhere.  Symptoms have been present for 2 days.  Rash is very itchy in nature.  No tongue swelling, throat swelling or difficulty breathing.  Patient is unaware of any new products that he could have been exposed to that he can be having a reaction to.        History reviewed. No pertinent past medical history.  There are no problems to display for this patient.   Past Surgical History:  Procedure Laterality Date  . APPENDECTOMY         No family history on file.  Social History   Tobacco Use  . Smoking status: Current Some Day Smoker    Packs/day: 0.50    Types: Cigarettes  . Smokeless tobacco: Never Used  Substance Use Topics  . Alcohol use: Yes  . Drug use: Yes    Types: Marijuana    Home Medications Prior to Admission medications   Medication Sig Start Date End Date Taking? Authorizing Provider  cetirizine-pseudoephedrine (ZYRTEC-D) 5-120 MG tablet Take 1 tablet by mouth daily. 03/19/17   Tasia Catchings, Amy V, PA-C  fluticasone (FLONASE) 50 MCG/ACT nasal spray Place 2 sprays into both nostrils daily. 03/19/17   Tasia Catchings, Amy V, PA-C  hydrOXYzine (ATARAX/VISTARIL) 25 MG tablet Take 1-2 tablets (25-50 mg total) by mouth every 6 (six) hours as needed for itching. 06/03/19   Orpah Greek, MD  ibuprofen (ADVIL,MOTRIN) 600 MG tablet Take 1 tablet (600 mg total) by mouth every 6 (six) hours as needed. 03/19/17   Tasia Catchings, Amy V, PA-C  methylPREDNISolone (MEDROL DOSEPAK) 4 MG TBPK tablet As directed 06/03/19   Orpah Greek, MD  permethrin (ELIMITE) 5 % cream Apply to entire body and leave on for 12 hours, then wash off 06/03/19   Camillo Quadros,  Gwenyth Allegra, MD    Allergies    Patient has no known allergies.  Review of Systems   Review of Systems  Skin: Positive for rash.  All other systems reviewed and are negative.   Physical Exam Updated Vital Signs BP 133/89 (BP Location: Right Arm)   Pulse 76   Temp 98.1 F (36.7 C) (Oral)   Resp 18   Ht 5\' 9"  (1.753 m)   Wt 63.5 kg   SpO2 93%   BMI 20.67 kg/m   Physical Exam Vitals and nursing note reviewed.  Constitutional:      General: He is not in acute distress.    Appearance: Normal appearance. He is well-developed.  HENT:     Head: Normocephalic and atraumatic.     Right Ear: Hearing normal.     Left Ear: Hearing normal.     Nose: Nose normal.  Eyes:     Conjunctiva/sclera: Conjunctivae normal.     Pupils: Pupils are equal, round, and reactive to light.  Cardiovascular:     Rate and Rhythm: Regular rhythm.     Heart sounds: S1 normal and S2 normal. No murmur. No friction rub. No gallop.   Pulmonary:     Effort: Pulmonary effort is normal. No respiratory distress.     Breath sounds: Normal breath sounds.  Chest:  Chest wall: No tenderness.  Abdominal:     General: Bowel sounds are normal.     Palpations: Abdomen is soft.     Tenderness: There is no abdominal tenderness. There is no guarding or rebound. Negative signs include Murphy's sign and McBurney's sign.     Hernia: No hernia is present.  Musculoskeletal:        General: Normal range of motion.     Cervical back: Normal range of motion and neck supple.  Skin:    General: Skin is warm and dry.     Findings: Rash (diffuse slightly raised maculo-papular rash) present.  Neurological:     Mental Status: He is alert and oriented to person, place, and time.     GCS: GCS eye subscore is 4. GCS verbal subscore is 5. GCS motor subscore is 6.     Cranial Nerves: No cranial nerve deficit.     Sensory: No sensory deficit.     Coordination: Coordination normal.  Psychiatric:        Speech: Speech  normal.        Behavior: Behavior normal.        Thought Content: Thought content normal.     ED Results / Procedures / Treatments   Labs (all labs ordered are listed, but only abnormal results are displayed) Labs Reviewed - No data to display  EKG None  Radiology No results found.  Procedures Procedures (including critical care time)  Medications Ordered in ED Medications  dexamethasone (DECADRON) injection 10 mg (has no administration in time range)    ED Course  I have reviewed the triage vital signs and the nursing notes.  Pertinent labs & imaging results that were available during my care of the patient were reviewed by me and considered in my medical decision making (see chart for details).    MDM Rules/Calculators/A&P                     Patient presents with a diffuse, nonurticarial slightly raised maculopapular rash.  Patient lives with 3 other individuals, no one else with similar rash.  Started on forearms and then spread out to the rest of body.  No systemic symptoms.  Patient appears well.  Will treat for acute allergic reaction, possible contact dermatitis. Final Clinical Impression(s) / ED Diagnoses Final diagnoses:  Rash  Allergic reaction, initial encounter    Rx / DC Orders ED Discharge Orders         Ordered    methylPREDNISolone (MEDROL DOSEPAK) 4 MG TBPK tablet     06/03/19 0114    hydrOXYzine (ATARAX/VISTARIL) 25 MG tablet  Every 6 hours PRN     06/03/19 0114    permethrin (ELIMITE) 5 % cream     06/03/19 0114           Gilda Crease, MD 06/03/19 [phone]

## 2020-08-01 ENCOUNTER — Ambulatory Visit (HOSPITAL_COMMUNITY): Admission: EM | Admit: 2020-08-01 | Discharge: 2020-08-01 | Discharge status: Against Medical Advice | Payer: Self-pay

## 2021-02-09 ENCOUNTER — Other Ambulatory Visit: Payer: Self-pay

## 2021-02-09 ENCOUNTER — Ambulatory Visit (HOSPITAL_COMMUNITY)
Admission: EM | Admit: 2021-02-09 | Discharge: 2021-02-09 | Disposition: A | Discharge status: Home / Self Care | Payer: Self-pay | Attending: Student | Admitting: Student

## 2021-02-09 ENCOUNTER — Encounter (HOSPITAL_COMMUNITY): Payer: Self-pay | Admitting: Emergency Medicine

## 2021-02-09 DIAGNOSIS — S46911A Strain of unspecified muscle, fascia and tendon at shoulder and upper arm level, right arm, initial encounter: Secondary | ICD-10-CM

## 2021-02-09 DIAGNOSIS — H6123 Impacted cerumen, bilateral: Secondary | ICD-10-CM

## 2021-02-09 MED ORDER — KETOROLAC TROMETHAMINE 10 MG PO TABS: 10.0000 mg | ORAL_TABLET | Freq: Four times a day (QID) | ORAL | 0 refills | Status: AC | PRN
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
## 2021-02-09 NOTE — Discharge Instructions (Addendum)
-  Toradol (Ketorolac), one pill up to every 6 hours for pain. Make sure to take this with food. Avoid other NSAIDs like ibuprofen, alleve, naproxen while taking this medication.  -You can also take Tylenol 1000mg  up to 3x daily -Rest, ice -Please make an appointment to follow-up with an orthopedist. I recommend [facility] at [address], [place], [place]. You can schedule an appointment by calling [phone]) or online (([phone]), but they also have a walk-in clinic M-F 8a-8p and Sat 10a-3p.

## 2021-02-09 NOTE — ED Triage Notes (Signed)
PT reports he had a shoulder injury 3 years ago. Pain and weakness in right shoulder has worsened progressively for 3 years.

## 2021-12-25 ENCOUNTER — Other Ambulatory Visit: Payer: Self-pay

## 2021-12-25 ENCOUNTER — Emergency Department (HOSPITAL_COMMUNITY)
Admission: EM | Admit: 2021-12-25 | Discharge: 2021-12-26 | Disposition: A | Discharge status: Home / Self Care | Payer: PRIVATE HEALTH INSURANCE | Attending: Emergency Medicine | Admitting: Emergency Medicine

## 2021-12-25 ENCOUNTER — Encounter (HOSPITAL_COMMUNITY): Payer: Self-pay

## 2021-12-25 ENCOUNTER — Emergency Department (HOSPITAL_COMMUNITY): Payer: PRIVATE HEALTH INSURANCE

## 2021-12-25 DIAGNOSIS — M25511 Pain in right shoulder: Secondary | ICD-10-CM | POA: Diagnosis not present

## 2021-12-25 NOTE — ED Provider Triage Note (Signed)
Emergency Medicine Provider Triage Evaluation Note  Joseph Fields , a 31 y.o. male  was evaluated in triage.  Pt complains of shoulder pain.  States he has had right shoulder pain over the last 3 years.  States when he lifts his arm overhead it clicks.  No recent falls or injuries.  He has never been assessed for this.  No redness, warmth or swelling.  Review of Systems  Positive: Right shoulder pain Negative:   Physical Exam  BP 125/76 (BP Location: Left Arm)   Pulse 80   Temp 98.5 F (36.9 C) (Oral)   Resp 16   SpO2 100%  Gen:   Awake, no distress   Resp:  Normal effort  MSK:   Moves extremities without difficulty, full rom Other:    Medical Decision Making  Medically screening exam initiated at 10:20 PM.  Appropriate orders placed.  Joseph Fields was informed that the remainder of the evaluation will be completed by another provider, this initial triage assessment does not replace that evaluation, and the importance of remaining in the ED until their evaluation is complete.  Right shoulder pain   Viveka Wilmeth A, PA-C 12/25/21 2220

## 2021-12-25 NOTE — ED Triage Notes (Signed)
Pt c/o intermittent right shoulder pain for past 3 years. Pt denies trauma/injury. Pt states its very painful at this time.

## 2021-12-26 MED ORDER — LIDOCAINE 5 % EX PTCH
1.0000 | MEDICATED_PATCH | CUTANEOUS | Status: DC
  Administered 2021-12-26: 1 via TRANSDERMAL
  Filled 2021-12-26: qty 1

## 2021-12-26 MED ORDER — LIDOCAINE 5 % EX PTCH: 1.0000 | MEDICATED_PATCH | CUTANEOUS | 0 refills | Status: AC

## 2021-12-26 MED ORDER — IBUPROFEN 800 MG PO TABS
800.0000 mg | ORAL_TABLET | Freq: Once | ORAL | Status: AC
  Administered 2021-12-26: 800 mg via ORAL
  Filled 2021-12-26: qty 1

## 2021-12-26 NOTE — Discharge Instructions (Addendum)
Take 800 of ibuprofen 3 times daily for the neck 7 days.  Try the Lidoderm patch every 12 hours as needed for pain.  Follow-up with a physician for more follow-up, information above.
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
## 2023-04-28 ENCOUNTER — Emergency Department (HOSPITAL_BASED_OUTPATIENT_CLINIC_OR_DEPARTMENT_OTHER)
Admission: EM | Admit: 2023-04-28 | Discharge: 2023-04-28 | Disposition: A | Discharge status: Home / Self Care | Payer: Medicaid Other | Attending: Emergency Medicine | Admitting: Emergency Medicine

## 2023-04-28 ENCOUNTER — Other Ambulatory Visit: Payer: Self-pay

## 2023-04-28 ENCOUNTER — Encounter (HOSPITAL_BASED_OUTPATIENT_CLINIC_OR_DEPARTMENT_OTHER): Payer: Self-pay | Admitting: Emergency Medicine

## 2023-04-28 DIAGNOSIS — Z1152 Encounter for screening for COVID-19: Secondary | ICD-10-CM | POA: Insufficient documentation

## 2023-04-28 DIAGNOSIS — R059 Cough, unspecified: Secondary | ICD-10-CM | POA: Insufficient documentation

## 2023-04-28 DIAGNOSIS — R0981 Nasal congestion: Secondary | ICD-10-CM | POA: Diagnosis not present

## 2023-04-28 LAB — SARS CORONAVIRUS 2 BY RT PCR: SARS Coronavirus 2 by RT PCR: NEGATIVE

## 2023-04-28 LAB — GROUP A STREP BY PCR: Group A Strep by PCR: NOT DETECTED

## 2023-04-28 MED ORDER — IBUPROFEN 200 MG PO TABS
600.0000 mg | ORAL_TABLET | Freq: Once | ORAL | Status: AC
  Administered 2023-04-28: 600 mg via ORAL
  Filled 2023-04-28: qty 1

## 2023-04-28 NOTE — ED Notes (Signed)

## 2023-04-28 NOTE — ED Provider Notes (Signed)
[place] EM[facility]GENCY DEPARTMENT AT [facility] Provider Note   CSN: [id number] Arrival date & time: 04/28/23  1102     History  Chief Complaint  Patient presents with   URI   HPI Joseph Fields is a 32 y.o. male for nasal congestion sore throat and cough for the past 3 days.  Denies shortness of breath or chest pain.  Unsure if he has been around any sick contacts.  Patient states he is a smoker.  Denies nausea vomiting diarrhea.  States he has been drinking a lot of water but not taking any medications for symptoms.  Denies fever.   URI      Home Medications Prior to Admission medications   Medication Sig Start Date End Date Taking? Authorizing Provider  cetirizine-pseudoephedrine (ZYRTEC-D) 5-120 MG tablet Take 1 tablet by mouth daily. 03/19/17   Cathie Hoops, Amy V, PA-C  fluticasone (FLONASE) 50 MCG/ACT nasal spray Place 2 sprays into both nostrils daily. 03/19/17   Cathie Hoops, Amy V, PA-C  hydrOXYzine (ATARAX/VISTARIL) 25 MG tablet Take 1-2 tablets (25-50 mg total) by mouth every 6 (six) hours as needed for itching. 06/03/19   Gilda Crease, MD  ibuprofen (ADVIL,MOTRIN) 600 MG tablet Take 1 tablet (600 mg total) by mouth every 6 (six) hours as needed. 03/19/17   Cathie Hoops, Amy V, PA-C  ketorolac (TORADOL) 10 MG tablet Take 1 tablet (10 mg total) by mouth every 6 (six) hours as needed. 02/09/21   Rhys Martini, PA-C  lidocaine (LIDODERM) 5 % Place 1 patch onto the skin daily. Remove & Discard patch within 12 hours or as directed by MD 12/26/21   Theron Arista, PA-C  methylPREDNISolone (MEDROL DOSEPAK) 4 MG TBPK tablet As directed 06/03/19   Gilda Crease, MD  permethrin (ELIMITE) 5 % cream Apply to entire body and leave on for 12 hours, then wash off 06/03/19   Pollina, Canary Brim, MD      Allergies    Patient has no known allergies.    Review of Systems   See HPI for pertinent positives  Physical Exam Updated Vital Signs BP 120/87 (BP Location: Right Arm)   Pulse  (!) 106   Temp 98.5 F (36.9 C) (Oral)   Resp 18   Ht 5\' 9"  (1.753 m)   Wt 59 kg   SpO2 100%   BMI 19.20 kg/m  Physical Exam Vitals and nursing note reviewed.  HENT:     Head: Normocephalic and atraumatic.     Mouth/Throat:     Mouth: Mucous membranes are moist.     Tongue: No lesions. Tongue does not deviate from midline.     Palate: No mass and lesions.     Pharynx: Oropharynx is clear. Uvula midline. No pharyngeal swelling, oropharyngeal exudate or posterior oropharyngeal erythema.  Eyes:     General:        Right eye: No discharge.        Left eye: No discharge.     Conjunctiva/sclera: Conjunctivae normal.  Cardiovascular:     Rate and Rhythm: Normal rate and regular rhythm.     Pulses: Normal pulses.     Heart sounds: Normal heart sounds.  Pulmonary:     Effort: Pulmonary effort is normal. No respiratory distress.     Breath sounds: Normal breath sounds. No wheezing, rhonchi or rales.  Abdominal:     General: Abdomen is flat.     Palpations: Abdomen is soft.  Skin:    General: Skin is  warm and dry.  Neurological:     General: No focal deficit present.  Psychiatric:        Mood and Affect: Mood normal.     ED Results / Procedures / Treatments   Labs (all labs ordered are listed, but only abnormal results are displayed) Labs Reviewed  SARS CORONAVIRUS 2 BY RT PCR  GROUP A STREP BY PCR    EKG None  Radiology No results found.  Procedures Procedures    Medications Ordered in ED Medications  ibuprofen (ADVIL) tablet 600 mg (has no administration in time range)    ED Course/ Medical Decision Making/ A&P                                 Medical Decision Making  33 year old well-appearing male presenting for URI symptoms going on for 3 days.  Exam is unremarkable strep and COVID PCR's are negative.  Doubt pneumonia.  However do suspect viral process.  Advised supportive treatment at home vitals stable.  Discharged in good  condition.        Final Clinical Impression(s) / ED Diagnoses Final diagnoses:  Nasal congestion  Cough, unspecified type    Rx / DC Orders ED Discharge Orders     None         Gareth Eagle, PA-C 04/28/23 1303    Ernie Avena, MD 04/28/23 1529

## 2023-04-28 NOTE — ED Triage Notes (Signed)
Pt via pov from home with URI and flu-like symptoms x 3 days. Pt alert & oriented, nad noted.

## 2023-04-28 NOTE — Discharge Instructions (Signed)
Evaluation today revealed that you likely have a upper respiratory virus.  Treatment is supportive at home which includes rest and hydration with water and Gatorade.  You can also take Tylenol and ibuprofen.  Please see your primary care doctor if your symptoms persist.

## 2023-09-22 ENCOUNTER — Ambulatory Visit
Admission: EM | Admit: 2023-09-22 | Discharge: 2023-09-22 | Disposition: A | Discharge status: Home / Self Care | Attending: Nurse Practitioner | Admitting: Nurse Practitioner

## 2023-09-22 DIAGNOSIS — H6123 Impacted cerumen, bilateral: Secondary | ICD-10-CM | POA: Diagnosis not present

## 2023-09-22 DIAGNOSIS — S60459A Superficial foreign body of unspecified finger, initial encounter: Secondary | ICD-10-CM

## 2023-09-22 NOTE — Discharge Instructions (Signed)
 We are able to remove the small foreign body from your right finger today.  Keep this area clean and dry.  Clean twice daily with soap and water.  Seek care if signs or symptoms of infection develop.  We cleaned your ears with hydrogen peroxide and water today.  Avoid using Q-tips or putting anything inside of your ear.  You can use over-the-counter Debrox drops to help loosen the earwax and help it come out in the future.

## 2023-09-22 NOTE — ED Triage Notes (Signed)
 Pt reports having a piece of glass in the right index finger, that he cannot seem to get out, started feeling the glass x 2 weeks ago is now unable to grasp anything with the finger that has the glass in it. Would also like ears cleaned out.

## 2023-09-22 NOTE — ED Provider Notes (Signed)
 [facility]    CSN: [id number] Arrival date & time: 09/22/23  1657      History   Chief Complaint No chief complaint on file.   HPI Joseph Fields is a 33 y.o. male.   Patient presents today with 2-week history of small piece of glass in the right index finger.  He reports it has messed up the nerves in his entire hand and it is painful for him to grab anything.  He denies swelling, drainage from the area, fevers, nausea/vomiting.  He does not know what the injury was from or where the glass came from.  He thinks he may have picked up some insulation.  Patient is also concerned about bilateral impacted cerumen.  Reports he has had to have his ears cleaned out before.  He has tried to clean out his ears with Q-tips without success.  He endorses muffled hearing, or denies any current ear pain.  No recent cough, congestion, sore throat, or fever.  No ear drainage.    History reviewed. No pertinent past medical history.  There are no active problems to display for this patient.   Past Surgical History:  Procedure Laterality Date   APPENDECTOMY         Home Medications    Prior to Admission medications   Medication Sig Start Date End Date Taking? Authorizing Provider  cetirizine-pseudoephedrine (ZYRTEC-D) 5-120 MG tablet Take 1 tablet by mouth daily. 03/19/17   Cathie Hoops, Amy V, PA-C  fluticasone (FLONASE) 50 MCG/ACT nasal spray Place 2 sprays into both nostrils daily. 03/19/17   Cathie Hoops, Amy V, PA-C  hydrOXYzine (ATARAX/VISTARIL) 25 MG tablet Take 1-2 tablets (25-50 mg total) by mouth every 6 (six) hours as needed for itching. 06/03/19   Gilda Crease, MD  ibuprofen (ADVIL,MOTRIN) 600 MG tablet Take 1 tablet (600 mg total) by mouth every 6 (six) hours as needed. 03/19/17   Cathie Hoops, Amy V, PA-C  ketorolac (TORADOL) 10 MG tablet Take 1 tablet (10 mg total) by mouth every 6 (six) hours as needed. 02/09/21   Rhys Martini, PA-C  lidocaine (LIDODERM) 5 % Place 1 patch onto the  skin daily. Remove & Discard patch within 12 hours or as directed by MD 12/26/21   Theron Arista, PA-C  methylPREDNISolone (MEDROL DOSEPAK) 4 MG TBPK tablet As directed 06/03/19   Gilda Crease, MD  permethrin (ELIMITE) 5 % cream Apply to entire body and leave on for 12 hours, then wash off 06/03/19   Pollina, Canary Brim, MD    Family History History reviewed. No pertinent family history.  Social History Social History   Tobacco Use   Smoking status: Some Days    Types: Cigars   Smokeless tobacco: Never   Tobacco comments:    4 per day  Vaping Use   Vaping status: Never Used  Substance Use Topics   Alcohol use: Yes    Comment: rarely   Drug use: Yes    Types: Marijuana    Comment: dailly     Allergies   Patient has no known allergies.   Review of Systems Review of Systems Per HPI  Physical Exam Triage Vital Signs ED Triage Vitals [09/22/23 1727]  Encounter Vitals Group     BP 120/80     Systolic BP Percentile      Diastolic BP Percentile      Pulse Rate 70     Resp 18     Temp 98.8 F (37.1 C)  Temp Source Oral     SpO2 97 %     Weight      Height      Head Circumference      Peak Flow      Pain Score 10     Pain Loc      Pain Education      Exclude from Growth Chart    No data found.  Updated Vital Signs BP 120/80 (BP Location: Right Arm)   Pulse 70   Temp 98.8 F (37.1 C) (Oral)   Resp 18   SpO2 97%   Visual Acuity Right Eye Distance:   Left Eye Distance:   Bilateral Distance:    Right Eye Near:   Left Eye Near:    Bilateral Near:     Physical Exam Vitals and nursing note reviewed.  Constitutional:      General: He is not in acute distress.    Appearance: Normal appearance. He is not toxic-appearing.  HENT:     Head: Normocephalic and atraumatic.     Right Ear: There is impacted cerumen.     Left Ear: There is impacted cerumen.     Mouth/Throat:     Mouth: Mucous membranes are moist.     Pharynx: Oropharynx is  clear.  Cardiovascular:     Rate and Rhythm: Normal rate.  Pulmonary:     Effort: Pulmonary effort is normal. No respiratory distress.  Musculoskeletal:       Hands:     Comments: Tiny foreign body noted to right lateral index finger in approximately area marked.  Foreign body appears to be shiny.  No surrounding erythema, active drainage, warmth.  Skin:    General: Skin is warm and dry.     Capillary Refill: Capillary refill takes less than 2 seconds.     Coloration: Skin is not jaundiced or pale.     Findings: No bruising or erythema.  Neurological:     Mental Status: He is alert and oriented to person, place, and time.  Psychiatric:        Behavior: Behavior is cooperative.      UC Treatments / Results  Labs (all labs ordered are listed, but only abnormal results are displayed) Labs Reviewed - No data to display  EKG   Radiology No results found.  Procedures Foreign Body Removal  Date/Time: 09/22/2023 6:52 PM  Performed by: Valentino Nose, NP Authorized by: Valentino Nose, NP   Consent:    Consent obtained:  Verbal   Consent given by:  Patient   Risks, benefits, and alternatives were discussed: yes     Risks discussed:  Bleeding, infection, pain, worsening of condition, poor cosmetic result, nerve damage and incomplete removal   Alternatives discussed:  Alternative treatment Universal protocol:    Procedure explained and questions answered to patient or proxy's satisfaction: yes     Patient identity confirmed:  Verbally with patient Location:    Location:  Finger   Finger location:  R index finger   Depth:  Intradermal   Tendon involvement:  None Pre-procedure details:    Neurovascular status: intact   Anesthesia:    Anesthesia method:  None Procedure type:    Procedure complexity:  Simple Procedure details:    Incision length:  1 mm   Localization method:  Visualized (23 g needle)   Dissection of underlying tissues: no     Bloodless field:  no     Removal mechanism:  Forceps  Foreign bodies recovered:  1   Description:  Tiny shiny object   Intact foreign body removal: yes   Post-procedure details:    Neurovascular status: intact     Confirmation:  No additional foreign bodies on visualization   Skin closure:  None   Dressing:  Adhesive bandage   Procedure completion:  Tolerated well, no immediate complications Ear Cerumen Removal  Date/Time: 09/22/2023 6:53 PM  Performed by: Valentino Nose, NP Authorized by: Valentino Nose, NP   Consent:    Consent obtained:  Verbal   Consent given by:  Patient   Risks, benefits, and alternatives were discussed: yes     Risks discussed:  Bleeding, infection, pain, TM perforation, incomplete removal and dizziness   Alternatives discussed:  Alternative treatment Universal protocol:    Procedure explained and questions answered to patient or proxy's satisfaction: yes     Patient identity confirmed:  Verbally with patient Procedure details:    Location:  L ear and R ear   Procedure type: irrigation     Procedure outcomes: unable to remove cerumen   Post-procedure details:    Inspection:  Some cerumen remaining and no bleeding   Hearing quality:  Improved   Procedure completion:  Tolerated well, no immediate complications  (including critical care time)  Medications Ordered in UC Medications - No data to display  Initial Impression / Assessment and Plan / UC Course  I have reviewed the triage vital signs and the nursing notes.  Pertinent labs & imaging results that were available during my care of the patient were reviewed by me and considered in my medical decision making (see chart for details).   Patient is well-appearing, normotensive, afebrile, not tachycardic, not tachypneic, oxygenating well on room air.    1. Foreign body in skin of finger, initial encounter Foreign body removal as above Patient tolerated well Band-Aid applied, encouraged good wound care and  return for signs or symptoms of infection  2. Impacted cerumen, bilateral Ear lavage as above Patient tolerated well Recommended use of Debrox drops to remove remaining cerumen Avoid Q-tips in the future  The patient was given the opportunity to ask questions.  All questions answered to their satisfaction.  The patient is in agreement to this plan.   Final Clinical Impressions(s) / UC Diagnoses   Final diagnoses:  Foreign body in skin of finger, initial encounter  Impacted cerumen, bilateral     Discharge Instructions      We are able to remove the small foreign body from your right finger today.  Keep this area clean and dry.  Clean twice daily with soap and water.  Seek care if signs or symptoms of infection develop.  We cleaned your ears with hydrogen peroxide and water today.  Avoid using Q-tips or putting anything inside of your ear.  You can use over-the-counter Debrox drops to help loosen the earwax and help it come out in the future.     ED Prescriptions   None    PDMP not reviewed this encounter.   Valentino Nose, NP 09/22/23 [phone]

## 2023-12-28 ENCOUNTER — Emergency Department (HOSPITAL_COMMUNITY)

## 2023-12-28 ENCOUNTER — Emergency Department (HOSPITAL_COMMUNITY)
Admission: EM | Admit: 2023-12-28 | Discharge: 2023-12-29 | Discharge status: Against Medical Advice | Attending: Emergency Medicine | Admitting: Emergency Medicine

## 2023-12-28 ENCOUNTER — Encounter (HOSPITAL_COMMUNITY): Payer: Self-pay | Admitting: Emergency Medicine

## 2023-12-28 ENCOUNTER — Other Ambulatory Visit: Payer: Self-pay

## 2023-12-28 DIAGNOSIS — Z5321 Procedure and treatment not carried out due to patient leaving prior to being seen by health care provider: Secondary | ICD-10-CM | POA: Insufficient documentation

## 2023-12-28 DIAGNOSIS — Y92410 Unspecified street and highway as the place of occurrence of the external cause: Secondary | ICD-10-CM | POA: Insufficient documentation

## 2023-12-28 DIAGNOSIS — M25562 Pain in left knee: Secondary | ICD-10-CM | POA: Insufficient documentation
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
## 2023-12-28 NOTE — ED Notes (Signed)
 Patient taken to xray at this time.

## 2023-12-29 NOTE — ED Notes (Signed)
 Called to repeatg vitals x3 no answer

## 2024-02-09 ENCOUNTER — Other Ambulatory Visit: Payer: Self-pay | Admitting: Family Medicine

## 2024-02-09 ENCOUNTER — Ambulatory Visit
Admission: RE | Admit: 2024-02-09 | Discharge: 2024-02-09 | Disposition: A | Discharge status: Home / Self Care | Source: Ambulatory Visit | Attending: Family Medicine | Admitting: Family Medicine

## 2024-02-09 DIAGNOSIS — G8929 Other chronic pain: Secondary | ICD-10-CM

## 2024-04-08 ENCOUNTER — Emergency Department (HOSPITAL_BASED_OUTPATIENT_CLINIC_OR_DEPARTMENT_OTHER)

## 2024-04-08 ENCOUNTER — Emergency Department (HOSPITAL_BASED_OUTPATIENT_CLINIC_OR_DEPARTMENT_OTHER)
Admission: EM | Admit: 2024-04-08 | Discharge: 2024-04-08 | Disposition: A | Discharge status: Home / Self Care | Attending: Emergency Medicine | Admitting: Emergency Medicine

## 2024-04-08 ENCOUNTER — Other Ambulatory Visit: Payer: Self-pay

## 2024-04-08 ENCOUNTER — Encounter (HOSPITAL_BASED_OUTPATIENT_CLINIC_OR_DEPARTMENT_OTHER): Payer: Self-pay

## 2024-04-08 DIAGNOSIS — R509 Fever, unspecified: Secondary | ICD-10-CM | POA: Diagnosis present

## 2024-04-08 DIAGNOSIS — B349 Viral infection, unspecified: Secondary | ICD-10-CM | POA: Insufficient documentation

## 2024-04-08 DIAGNOSIS — F17200 Nicotine dependence, unspecified, uncomplicated: Secondary | ICD-10-CM | POA: Insufficient documentation

## 2024-04-08 LAB — RESP PANEL BY RT-PCR (RSV, FLU A&B, COVID)  RVPGX2
Influenza A by PCR: NEGATIVE
Influenza B by PCR: NEGATIVE
Resp Syncytial Virus by PCR: NEGATIVE
SARS Coronavirus 2 by RT PCR: NEGATIVE

## 2024-04-08 MED ORDER — BENZONATATE 100 MG PO CAPS: 100.0000 mg | ORAL_CAPSULE | Freq: Three times a day (TID) | ORAL | 0 refills | Status: AC

## 2024-04-08 NOTE — Discharge Instructions (Addendum)
 Your symptoms today are most consistent with a viral upper respiratory illness, continue over the counter medications like Tylenol /Ibuprofen  as needed for pain, Sudafed as needed for nasal congestion, and Robitussin for cough. Start Tessalon and use every 8 hours as needed for cough. Return to the emergency department if your symptoms worsen. I have provided you with the contact information for [facility], please schedule follow-up to establish care since you do not have a PCP locally.

## 2024-04-08 NOTE — ED Provider Notes (Signed)
 [facility] AT [facility] Provider Note   CSN: [id number] Arrival date & time: 04/08/24  1504     Patient presents with: Fever and Cough   Joseph Fields is a 33 y.o. male.   33 year old male presenting with cough/scratchy throat/nasal congestion.  Symptoms have been ongoing for 1.5 weeks.  He has tried over-the-counter medications like Tylenol  Cold and flu and NyQuil/DayQuil with some relief of his symptoms.  He endorses a productive cough as well as subjective fevers at home.  Denies abdominal pain, nausea, vomiting, chest pain, shortness of breath.  He also endorses a limited appetite secondary to his symptoms.   Fever Associated symptoms: cough   Cough      Prior to Admission medications   Medication Sig Start Date End Date Taking? Authorizing Provider  cetirizine -pseudoephedrine  (ZYRTEC -D) 5-120 MG tablet Take 1 tablet by mouth daily. 03/19/17   Babara, Amy V, PA-C  fluticasone  (FLONASE ) 50 MCG/ACT nasal spray Place 2 sprays into both nostrils daily. 03/19/17   Babara, Amy V, PA-C  hydrOXYzine  (ATARAX /VISTARIL ) 25 MG tablet Take 1-2 tablets (25-50 mg total) by mouth every 6 (six) hours as needed for itching. 06/03/19   Haze Lonni PARAS, MD  ibuprofen  (ADVIL ,MOTRIN ) 600 MG tablet Take 1 tablet (600 mg total) by mouth every 6 (six) hours as needed. 03/19/17   Babara, Amy V, PA-C  ketorolac  (TORADOL ) 10 MG tablet Take 1 tablet (10 mg total) by mouth every 6 (six) hours as needed. 02/09/21   Graham, Laura E, PA-C  lidocaine  (LIDODERM ) 5 % Place 1 patch onto the skin daily. Remove & Discard patch within 12 hours or as directed by MD 12/26/21   Emelia Sluder, PA-C  methylPREDNISolone  (MEDROL  DOSEPAK) 4 MG TBPK tablet As directed 06/03/19   Haze Lonni PARAS, MD  permethrin  (ELIMITE ) 5 % cream Apply to entire body and leave on for 12 hours, then wash off 06/03/19   Pollina, Lonni PARAS, MD    Allergies: Patient has no known allergies.    Review of  Systems  Respiratory:  Positive for cough.     Updated Vital Signs  Vitals:   04/08/24 1510 04/08/24 1511 04/08/24 1630 04/08/24 1700  BP:   111/85 95/83  Pulse:  76 (!) 55 70  Resp:      Temp:      TempSrc:      SpO2:  100% 100% 100%  Weight: 59 kg     Height: 5' 9 (1.753 m)        Physical Exam Vitals and nursing note reviewed.  HENT:     Head: Normocephalic.     Comments: Posterior oropharynx is without erythema/edema/exudate, uvula midline, normal phonation, no sublingual edema Eyes:     Extraocular Movements: Extraocular movements intact.  Cardiovascular:     Rate and Rhythm: Normal rate and regular rhythm.     Heart sounds: Normal heart sounds.  Pulmonary:     Effort: Pulmonary effort is normal.     Breath sounds: Normal breath sounds.  Musculoskeletal:     Cervical back: Normal range of motion.     Comments: Moves all extremities spontaneously without difficulty  Skin:    General: Skin is warm and dry.  Neurological:     Mental Status: He is alert and oriented to person, place, and time.     (all labs ordered are listed, but only abnormal results are displayed) Labs Reviewed  RESP PANEL BY RT-PCR (RSV, FLU A&B, COVID)  [id number]  EKG: None  Radiology: DG Chest Portable 1 View Result Date: 04/08/2024 CLINICAL DATA:  Productive cough for 1-1/2 weeks. EXAM: PORTABLE CHEST 1 VIEW COMPARISON:  None Available. FINDINGS: The cardiomediastinal contours are normal. There may be trace pleural effusions. Pulmonary vasculature is normal. No consolidation or pneumothorax. No acute osseous abnormalities are seen. IMPRESSION: Possible trace pleural effusions. Electronically Signed   By: Andrea Gasman M.D.   On: 04/08/2024 17:04     Procedures   Medications Ordered in the ED - No data to display                                  Medical Decision Making This patient presents to the ED for concern of cough/sore throat/congestion, this involves an extensive  number of treatment options, and is a complaint that carries with it a high risk of complications and morbidity.  The differential diagnosis includes COVID/Flu/RSV, other viral illness, pneumonia    Additional history obtained:  Additional history obtained from record review External records from outside source obtained and reviewed including prior UC note   Lab Tests:  I Ordered, and personally interpreted labs.  The pertinent results include: COVID/flu/RSV negative   Imaging Studies ordered:  I ordered imaging studies including CXR  I independently visualized and interpreted imaging which showed Possible trace pleural effusions.  I agree with the radiologist interpretation   Cardiac Monitoring: / EKG:  The patient was maintained on a cardiac monitor.  I personally viewed and interpreted the cardiac monitored which showed an underlying rhythm of: NSR   Problem List / ED Course / Critical interventions / Medication management I have reviewed the patients home medicines and have made adjustments as needed   Social Determinants of Health:  Tobacco use   Test / Admission - Considered:  Physical exam is largely unremarkable as above, posterior oropharynx is without appreciable erythema/edema/exudate, lungs are clear to auscultation bilaterally without adventitious sounds.  Patient has been symptomatic for 1.5 weeks, likely consistent with viral upper respiratory illness but will obtain chest x-ray to assess for pneumonia. Chest x-ray is notable for possible trace pleural effusions bilaterally, I suspect this is likely secondary to what is likely a viral illness, as there is no evidence of consolidation that would raise suspicion for pneumonia.  COVID/flu/RSV testing negative.  I will prescribe the patient with Tessalon Perles to be used as needed for cough, I recommend that he continue OTC agents like ibuprofen /Tylenol /Robitussin/Sudafed as needed for continued symptomatic  management.  He voiced understanding and is in agreement this plan, return precautions discussed, I will provide him with the contact information for [facility] to establish care since he does not have a primary care provider locally.  He is appropriate for discharge at this time.    Amount and/or Complexity of Data Reviewed Radiology: ordered.  Risk Prescription drug management.        Final diagnoses:  Viral illness    ED Discharge Orders          Ordered    benzonatate (TESSALON) 100 MG capsule  Every 8 hours        04/08/24 1719               Glendia Rocky SAILOR, PA-C 04/08/24 1842    Lenor Andrea, MD 04/08/24 2320

## 2024-04-08 NOTE — ED Triage Notes (Signed)
 Arrives ambulatory to the ED with complaints of cough and fever x1 week. Minimal relief with OTC meds.
